# Patient Record
Sex: Male | Born: 1935 | Race: White | Hispanic: No | Marital: Married | Smoking: Former smoker
Health system: Southern US, Community
[De-identification: ages and names within clinical notes are randomized; demographics above are authoritative.]

## PROBLEM LIST (undated history)

## (undated) DIAGNOSIS — I10 Essential (primary) hypertension: Secondary | ICD-10-CM

## (undated) DIAGNOSIS — M1990 Unspecified osteoarthritis, unspecified site: Secondary | ICD-10-CM

## (undated) DIAGNOSIS — D649 Anemia, unspecified: Secondary | ICD-10-CM

## (undated) DIAGNOSIS — R0600 Dyspnea, unspecified: Secondary | ICD-10-CM

## (undated) DIAGNOSIS — R059 Cough, unspecified: Secondary | ICD-10-CM

## (undated) DIAGNOSIS — J449 Chronic obstructive pulmonary disease, unspecified: Secondary | ICD-10-CM

## (undated) DIAGNOSIS — R911 Solitary pulmonary nodule: Secondary | ICD-10-CM

## (undated) DIAGNOSIS — E785 Hyperlipidemia, unspecified: Secondary | ICD-10-CM

## (undated) DIAGNOSIS — R05 Cough: Secondary | ICD-10-CM

## (undated) HISTORY — DX: Dyspnea, unspecified: R06.00

## (undated) HISTORY — DX: Hyperlipidemia, unspecified: E78.5

## (undated) HISTORY — DX: Cough, unspecified: R05.9

## (undated) HISTORY — DX: Anemia, unspecified: D64.9

## (undated) HISTORY — PX: ROTATOR CUFF REPAIR: SHX139

## (undated) HISTORY — DX: Unspecified osteoarthritis, unspecified site: M19.90

## (undated) HISTORY — DX: Cough: R05

## (undated) HISTORY — DX: Chronic obstructive pulmonary disease, unspecified: J44.9

## (undated) HISTORY — PX: APPENDECTOMY: SHX54

## (undated) HISTORY — DX: Essential (primary) hypertension: I10

## (undated) HISTORY — DX: Solitary pulmonary nodule: R91.1

## (undated) HISTORY — PX: REPLACEMENT TOTAL KNEE BILATERAL: SUR1225

---

## 1994-09-30 HISTORY — PX: OTHER SURGICAL HISTORY: SHX169

## 2003-09-02 ENCOUNTER — Other Ambulatory Visit: Payer: Self-pay

## 2008-09-20 ENCOUNTER — Ambulatory Visit: Payer: Self-pay | Admitting: Internal Medicine

## 2008-09-20 ENCOUNTER — Encounter: Payer: Self-pay | Admitting: Pulmonary Disease

## 2008-10-04 ENCOUNTER — Ambulatory Visit: Payer: Self-pay | Admitting: Internal Medicine

## 2008-10-04 ENCOUNTER — Encounter: Payer: Self-pay | Admitting: Pulmonary Disease

## 2008-10-18 ENCOUNTER — Ambulatory Visit: Payer: Self-pay

## 2008-10-18 ENCOUNTER — Encounter: Payer: Self-pay | Admitting: Internal Medicine

## 2008-10-26 ENCOUNTER — Ambulatory Visit: Payer: Self-pay | Admitting: Internal Medicine

## 2008-11-02 DIAGNOSIS — J4489 Other specified chronic obstructive pulmonary disease: Secondary | ICD-10-CM | POA: Insufficient documentation

## 2008-11-02 DIAGNOSIS — E785 Hyperlipidemia, unspecified: Secondary | ICD-10-CM

## 2008-11-02 DIAGNOSIS — I10 Essential (primary) hypertension: Secondary | ICD-10-CM | POA: Insufficient documentation

## 2008-11-02 DIAGNOSIS — J449 Chronic obstructive pulmonary disease, unspecified: Secondary | ICD-10-CM

## 2008-11-03 ENCOUNTER — Ambulatory Visit: Payer: Self-pay | Admitting: Pulmonary Disease

## 2008-11-03 ENCOUNTER — Ambulatory Visit: Payer: Self-pay | Admitting: Internal Medicine

## 2008-11-03 DIAGNOSIS — R05 Cough: Secondary | ICD-10-CM

## 2008-11-03 DIAGNOSIS — M1990 Unspecified osteoarthritis, unspecified site: Secondary | ICD-10-CM | POA: Insufficient documentation

## 2008-11-03 DIAGNOSIS — J984 Other disorders of lung: Secondary | ICD-10-CM | POA: Insufficient documentation

## 2008-11-28 ENCOUNTER — Telehealth (INDEPENDENT_AMBULATORY_CARE_PROVIDER_SITE_OTHER): Payer: Self-pay | Admitting: *Deleted

## 2008-11-29 ENCOUNTER — Ambulatory Visit: Payer: Self-pay | Admitting: Internal Medicine

## 2008-11-29 DIAGNOSIS — D649 Anemia, unspecified: Secondary | ICD-10-CM | POA: Insufficient documentation

## 2008-11-29 DIAGNOSIS — R0609 Other forms of dyspnea: Secondary | ICD-10-CM | POA: Insufficient documentation

## 2008-11-29 DIAGNOSIS — R0989 Other specified symptoms and signs involving the circulatory and respiratory systems: Secondary | ICD-10-CM

## 2008-11-29 LAB — CONVERTED CEMR LAB
BUN: 19 mg/dL (ref 6–23)
Basophils Relative: 1.3 % (ref 0.0–3.0)
Calcium: 9 mg/dL (ref 8.4–10.5)
Chloride: 108 meq/L (ref 96–112)
Creatinine, Ser: 1.4 mg/dL (ref 0.4–1.5)
Eosinophils Absolute: 0.5 10*3/uL (ref 0.0–0.7)
Eosinophils Relative: 6.3 % — ABNORMAL HIGH (ref 0.0–5.0)
GFR calc Af Amer: 64 mL/min
GFR calc non Af Amer: 53 mL/min
HCT: 33 % — ABNORMAL LOW (ref 39.0–52.0)
Hemoglobin: 10.6 g/dL — ABNORMAL LOW (ref 13.0–17.0)
MCV: 75.1 fL — ABNORMAL LOW (ref 78.0–100.0)
Monocytes Absolute: 0.6 10*3/uL (ref 0.1–1.0)
Neutro Abs: 5.3 10*3/uL (ref 1.4–7.7)
RBC: 4.39 M/uL (ref 4.22–5.81)
WBC: 8.4 10*3/uL (ref 4.5–10.5)

## 2008-12-15 ENCOUNTER — Ambulatory Visit: Payer: Self-pay | Admitting: Pulmonary Disease

## 2008-12-16 ENCOUNTER — Telehealth (INDEPENDENT_AMBULATORY_CARE_PROVIDER_SITE_OTHER): Payer: Self-pay | Admitting: *Deleted

## 2008-12-21 ENCOUNTER — Telehealth: Payer: Self-pay | Admitting: Pulmonary Disease

## 2009-01-17 ENCOUNTER — Ambulatory Visit: Payer: Self-pay | Admitting: Pulmonary Disease

## 2009-02-06 ENCOUNTER — Ambulatory Visit (HOSPITAL_COMMUNITY): Admission: RE | Admit: 2009-02-06 | Discharge: 2009-02-06 | Payer: Self-pay | Admitting: Pulmonary Disease

## 2009-02-06 IMAGING — CT CT CHEST W/O CM
2 of 4 series · 15 of 36 positions shown, 18 images · non-contrast
Comparison: [DATE]

CLINICAL DATA: Left lower lobe nodule

CT CHEST WITHOUT CONTRAST
TECHNIQUE: Multidetector CT imaging of the chest was performed
following the standard protocol without IV contrast.

[Series 2: chest w/o st · axial · non-contrast · 0.83mm/px · z∈[-233,+7]mm · 12 of 58 slices shown, 15 images]
[im 5/58  mediastinal]
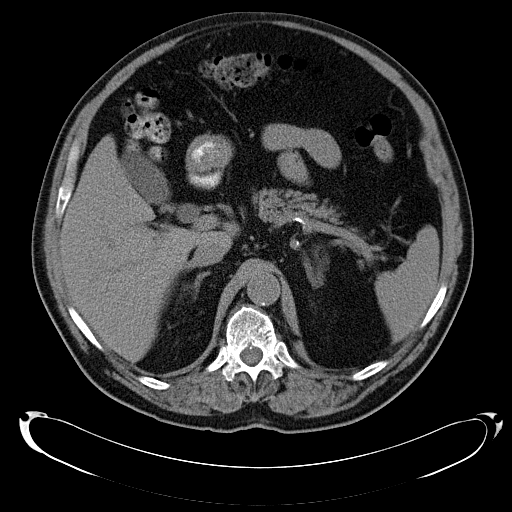
[im 5/58  lung]
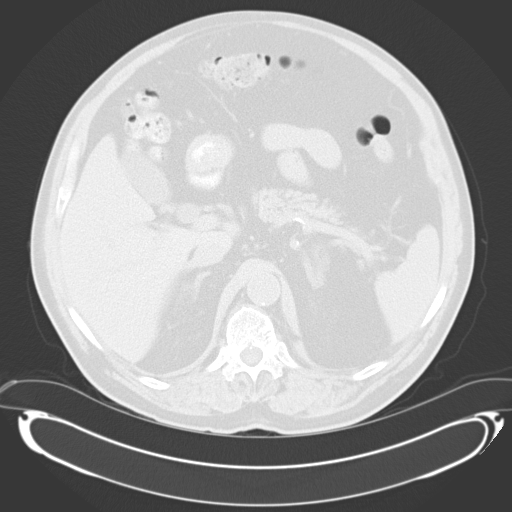
[im 9/58  lung]
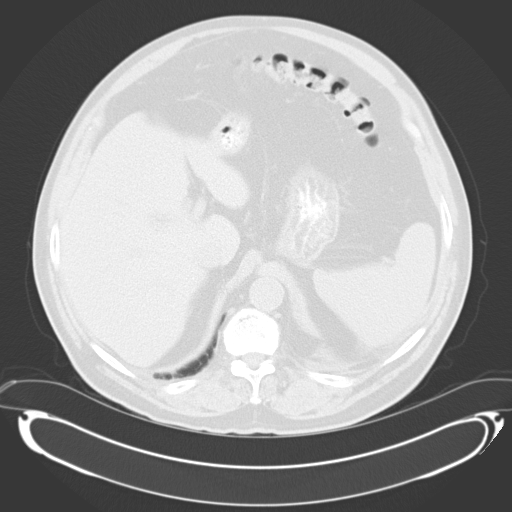
[im 13/58  lung]
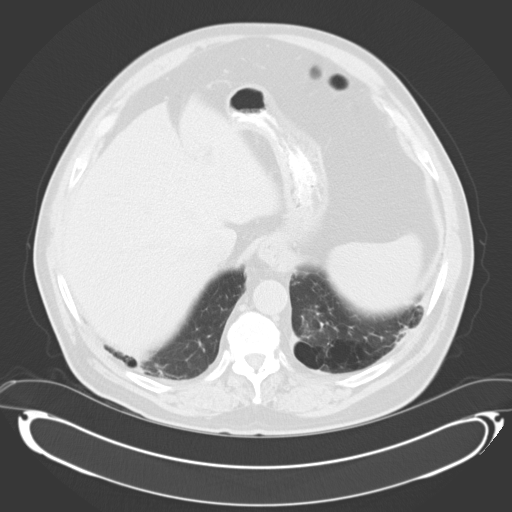
[im 17/58  lung]
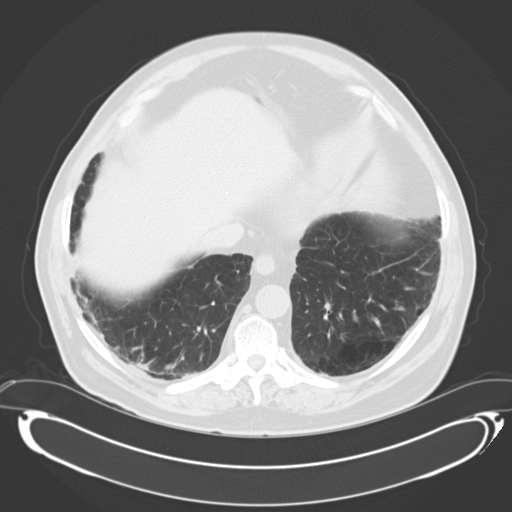
[im 21/58  mediastinal]
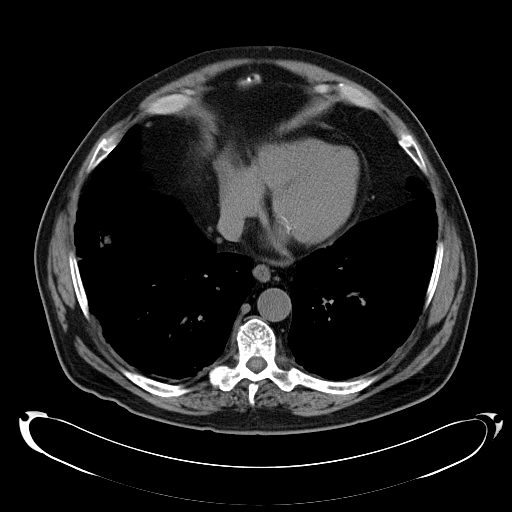
[im 21/58  lung]
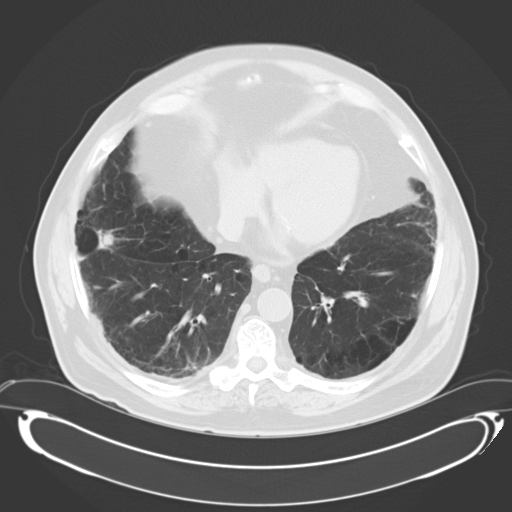
[im 25/58  lung]
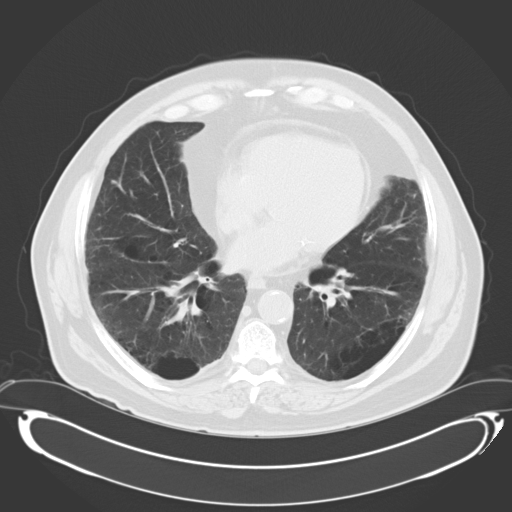
[im 33/58  lung]
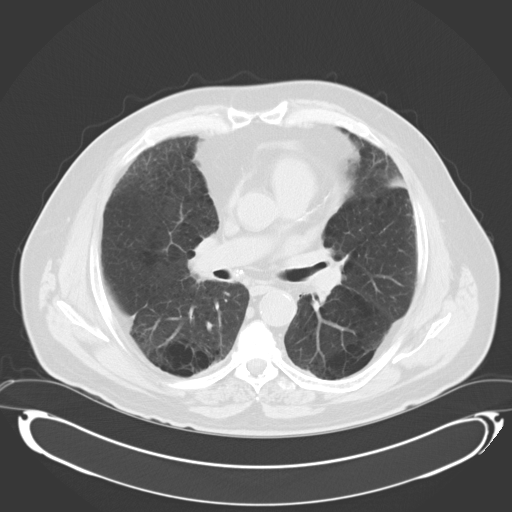
[im 37/58  lung]
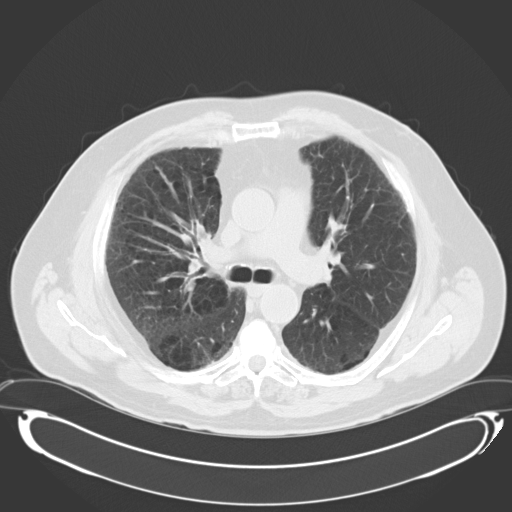
[im 41/58  mediastinal]
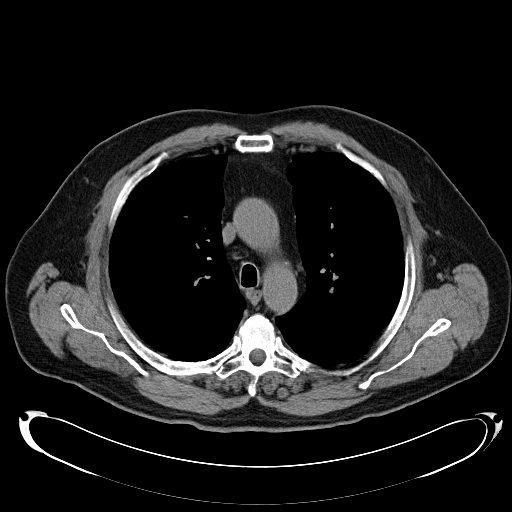
[im 41/58  lung]
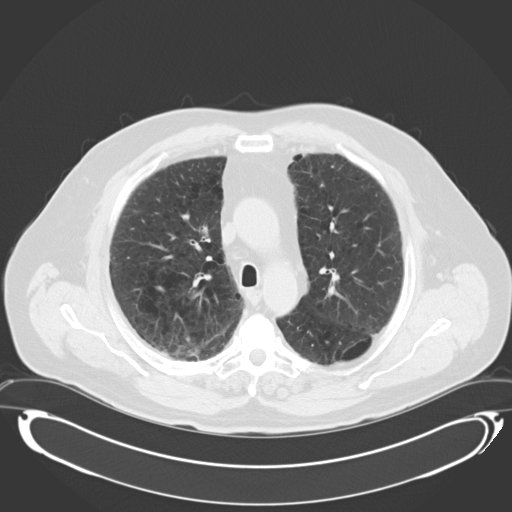
[im 45/58  lung]
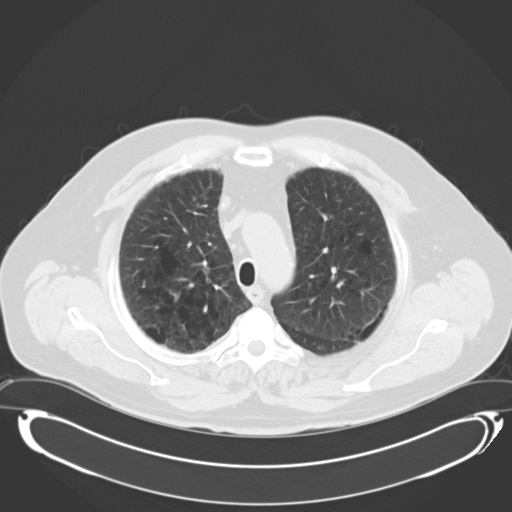
[im 49/58  lung]
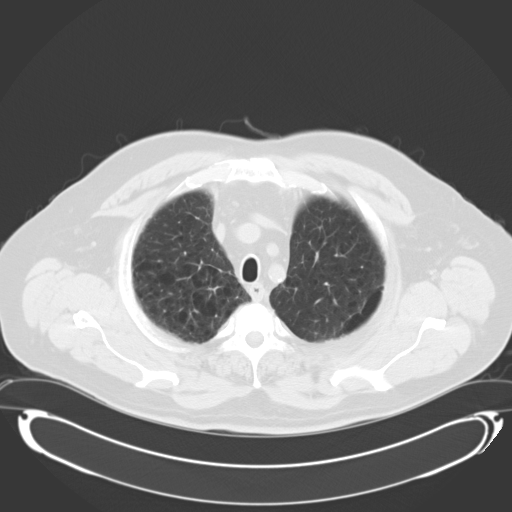
[im 53/58  lung]
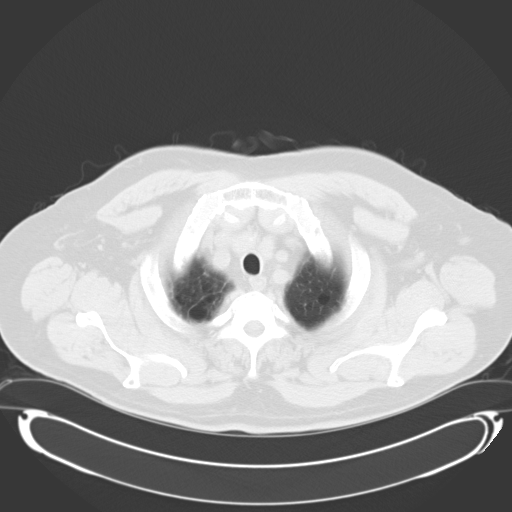

[Series 602: <mpr thick range> · coronal · 0.83mm/px · 3 of 104 slices shown]
[im 21/104  lung]
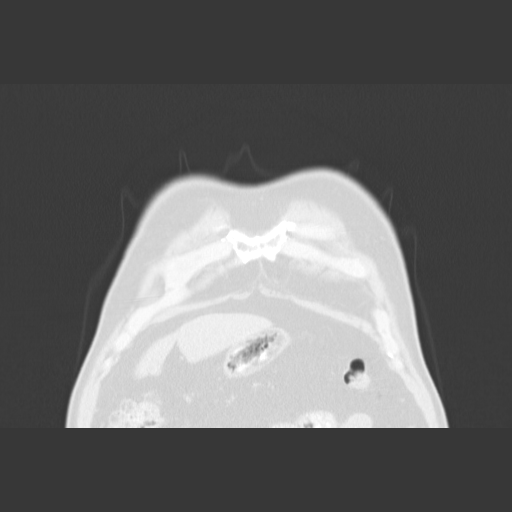
[im 42/104  lung]
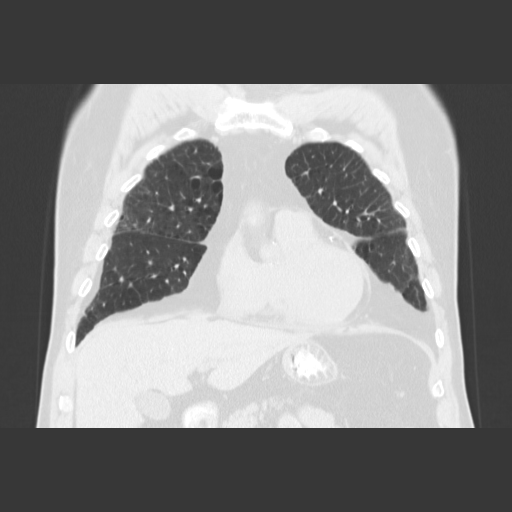
[im 62/104  lung]
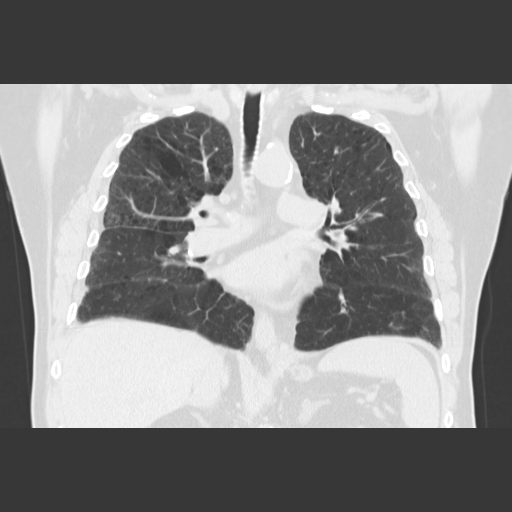

[15 of 36 positions shown; findings below may reference images not displayed]

FINDINGS: There is no enlarged axillary or supraclavicular lymph
nodes.

No enlarged mediastinal or hilar lymph nodes identified.

Calcified subcarinal and right hilar lymph node noted.

There is no pericardial effusion.

No pleural effusion.

Advanced interstitial lung disease is noted consistent with
pulmonary interstitial emphysema.

The subpleural density within the left lower lobe is again noted
measuring 0.6 x 1.4 cm, image 45. This is compared with 0.8 x
cm previously.

There is a subpleural density within the right middle lobe which
measures 0.9 cm, image 38.  On the previous exam this nodule
measured 0.3 cm.

Calcified granulomas identified within the perihilar right upper
lobe.

Review of the visualized osseous structures shows no suspicious
bone lesions.

Limited imaging through the upper abdomen shows a low density
within the left adrenal gland measuring 1.4 cm.

There are multiple calcified granulomas within the splenic
parenchyma.
IMPRESSION: 1.  Stable subpleural density within the left lower lobe.  There is
no malignant range FDG uptake associated this nodule on the PET CT
performed today.  The short axis diameter of this density lowers
the negative predicted value of PET CT.  For this reason, I would
advise continued interval follow-up overall parenchymal densities.
2.  Increase in size of density within the right middle lobe.  The
small size of this nodule also lowers the negative predicted value
of PET CT.  Given the patient's smoking history I would recommend a
follow-up examination and 3 months.  At this time of noncontrast CT
of the chest would be sufficient.

## 2009-02-21 ENCOUNTER — Ambulatory Visit: Payer: Self-pay | Admitting: Pulmonary Disease

## 2010-10-22 ENCOUNTER — Encounter: Payer: Self-pay | Admitting: Pulmonary Disease

## 2011-01-08 LAB — GLUCOSE, CAPILLARY: Glucose-Capillary: 105 mg/dL — ABNORMAL HIGH (ref 70–99)

## 2011-02-12 NOTE — Assessment & Plan Note (Signed)
[facility] OFFICE NOTE   [name], [name]                       MRN:          [id number]  DATE:09/20/2008                            DOB:          [birth year]    PRIMARY CARE PHYSICIAN:  Dr. Andrey Campanile at the [place], [place].   REFERRING PHYSICIAN:  Dr. Karleen Hampshire at [facility].   REASON FOR EVALUATION:  Dyspnea on exertion and fatigue.   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  He denies any history of known heart  disease.  He did have a stress test at the [place] several years ago, which  he said was negative.   He tells me that about 2-1/2 months ago, he underwent arthroscopic  surgery to remove some bone spurs in his back.  Since this surgery, he  has had a total lack of energy.  Prior to this, he was playing golf and  able to go to the driving range and do some potting without any  problems.  However, now he finds it difficult to walk more than 100  yards at a time.  He has decreased exercise tolerance and shortness of  breath.  He has not had any chest pain or palpitations.  He denies any  orthopnea, no lower extremity edema, and no PND.  He does have a  nonproductive cough and is wondering if he may have walking pneumonia.  He states he has gained about 15 pounds as well.  He had his thyroid  levels checked at the [place], [place].  He was told these were a little high,  but nothing to worry about.   REVIEW OF SYSTEMS:  He also is noted to have snoring, but his wife  states that he does not stop breathing.  He also has arthritis pain and  some reflux as well as prostate symptoms.  The remainder review of  systems is negative except for HPI and problem list.   PROBLEM LIST:  1. Hypertension.  2. Hyperlipidemia.  3. Probable chronic obstructive pulmonary disease.  4. Obesity.  5. Osteoarthritis.   PAST SURGICAL HISTORY:  1. Status post appendix  2. Status post back fusion 17 or 15 years ago with a recent      arthroscopic surgery to remove bone spurs.  3. Bilateral knee replacement.  4. Rotator cuff surgery.   CURRENT MEDICATIONS:  1. Albuterol inhalers.  2. Norvasc 5 a day.  3. HCTZ 25 a day.  4. Lisinopril 40 a day.  5. Terazosin 10 a day.  6. Colace 200 b.i.d.  7. Flunisolide nasal spray.  8. Loratadine 10 a day.  9. Simvastatin 40 a day.  10.Omeprazole 40 a day.  11.Celebrex 100 b.i.d.  12.Vitamins.  13.Motrin.   ALLERGIES:  No known drug allergies.   SOCIAL HISTORY:  He is married.  He has 3 children.  He is on  disability.  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  He  smoked, but quit 15 years ago.  Does not drink alcohol.   FAMILY HISTORY:  Father died of rheumatic heart disease.  Mother died  from old age.  He had 2 brothers, one died of alcoholism, the other died  from rheumatic heart disease.   PHYSICAL EXAMINATION:  GENERAL:  He is an elderly male, in no acute  distress, ambulates around the clinic without any respiratory  difficulty.  VITAL SIGNS:  Initial saturations were 98% on room air, on ambulating he  got down to 93.  Blood pressure is 142/68, heart rate is 92, and weight  is 218.  HEENT:  Normal.  NECK:  Thick and supple.  There is JVD.  Carotids are 2+ bilaterally  with no obvious bruits.  There is no lymphadenopathy or thyromegaly.  CARDIAC:  PMI is nondisplaced.  He is regular.  There is an S4 soft  systolic ejection murmur at the left sternal border.  LUNGS:  Clear with mildly decreased breath sounds.  No wheezing.  No  rales.  ABDOMEN:  Markedly obese, no obvious hepatosplenomegaly.  No bruits.  No  masses.  Good bowel sounds.  EXTREMITIES:  Warm with no cyanosis, clubbing, or edema.  No rash.  NEUROLOGIC:  Alert and oriented x3.  Cranial nerves II through XII are  intact.  Moves all 4 without  difficulty.  Affect is pleasant.   EKG shows sinus rhythm at a rate of 92 with no ST-T wave abnormalities.   ASSESSMENT AND PLAN:  1. Exertional dyspnea and fatigue.  This is a rather new onset.  I      suspect that this is multifactorial and likely a combination of      deconditioning, obesity, diastolic dysfunction, and chronic      obstructive pulmonary disease.  However, given the fairly rapid      time course, I think we also need to rule out underlying coronary      artery disease.  We discussed the possibilities of stress testing      versus cath.  We will proceed with a Myoview scan and an      echocardiogram.  We will also get PFTs and a chest x-ray as a      starting point.  We will get the results of these tests and I will      see him back in several weeks to discuss.  2. Chronic hypertension.  This is elevated.  This is followed by his      primary care physician.  Consider increasing his amlodipine.   DISPOSITION:  Pending the results of his studies.  At some point, he may  also benefit from a cardiopulmonary exercise test if needed.     Bevelyn Buckles. Bensimhon, MD  Electronically Signed    DRB/MedQ  DD: 09/20/2008  DT: 09/21/2008  Job #: [id number]   cc:   Dr. Karleen Hampshire

## 2011-02-12 NOTE — Assessment & Plan Note (Signed)
[facility] OFFICE NOTE   NAME:[name], [name] DINI                       MRN:          [id number]  DATE:10/26/2008                            DOB:          Mar 24, 1931    PRIMARY CARE PHYSICIAN:  Dr. Andrey Campanile at the [facility] in [place].   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  I first saw him back in December for  further evaluation of progressive exertional dyspnea.   Since that time, he has undergone a significant amount of testing.  He  has had an echocardiogram, which showed a normal ejection fraction and  mild aortic stenosis with a mean valve gradient of 15.8.  There is also  mild mitral regurgitation.  He also underwent a dobutamine Myoview which  showed an EF of 73%, with no ischemia or scar.  PFTs were done, which  showed moderate obstructive lung disease with moderate-to-severely  decreased DLCO, which corrected significantly for volume but still was  not normal.  His FEV-1 to FVC ratio was 64%.  Chest x-ray was consistent  with COPD.   He returns today for a routine followup.  He continues to have  significant dyspnea on just mild exertion.  He has not had any chest  pain or pressure.  No orthopnea, no PND, no lower extremity edema.   CURRENT MEDICATIONS:  1. Albuterol inhaler.  2. Amlodipine 5 a day.  3. HCTZ 25 a day.  4. Lisinopril 40 a day.  5. Terazosin 10 a day.  6. Colace 100 b.i.d.  7. Flunisolide nasal spray b.i.d.  8. Loratadine.  9. Simvastatin 40 a day.  10.Omeprazole 40 a day.  11.Celebrex 100 a day.  12.Aleve as needed.   PHYSICAL EXAMINATION:  GENERAL:  He is in no acute distress.  He  ambulates around the clinic without any respiratory difficulty.  On  ambulation, his saturations stay 94% or higher.  VITALS SIGNS:  Blood pressure 142/64, heart rate 80,  weight 214.  HEENT:  Normal.  NECK:  Supple.  No obvious JVD.  Carotids are 2+ bilaterally without any  bruits.  There is no lymphadenopathy or thyromegaly.  CARDIAC:  He has  distant heart sounds.  PMI is nondisplaced.  Regular rate and rhythm  with an S4 and a soft systolic ejection murmur over the right sternal  border.  S2 is preserved.  LUNGS:  Diminished breath sounds throughout with no wheezing, no rales.  There is a prolonged expiratory phase.  ABDOMEN:  Obese, nontender, nondistended.  No obvious  hepatosplenomegaly.  No bruits.  No masses.  Good bowel sounds.  EXTREMITIES:  Warm with no cyanosis, clubbing, or edema.  No rash.  NEUROLOGIC:  Alert and oriented x3.  Cranial nerves II through XII are  intact.  Moves all 4 without difficulty.  Affect is pleasant.   ASSESSMENT/PLAN:  1. Exertional dyspnea.  I suspect this is most likely related to      underlying lung disease.  His cardiac workup so far has been      negative except for mild aortic stenosis, which I doubt is a      significant factor here.  Given his decreased DLCO, we will proceed      with a high-resolution CT scan of the chest to rule out pulmonary      fibrosis, though I think this is less likely.  We have also      referred him to a pulmonologist for further evaluation.  We will      see him back in several months as needed.  Should his dyspnea not      be responding to pulmonary therapy, we could consider cardiac      catheterization in the future, but I do not see any indication for      it currently.  2. Hypertension.  This is followed by his primary care physician with      suggestion of increasing his amlodipine to 10 a day.  I would also      consider minimizing or stopping his Aleve and Celebrex, as these      are likely contributing to his hypertension and also can be      dangerous for his kidneys.     Bevelyn Buckles. Bensimhon, MD  Electronically Signed    DRB/MedQ  DD: 10/26/2008  DT: 10/27/2008   Job #: [id number]   cc:   Eliot Ford, M.D.  @ [facility] Dr. Andrey Campanile

## 2011-03-21 ENCOUNTER — Encounter: Payer: Self-pay | Admitting: Cardiovascular Disease

## 2012-07-29 ENCOUNTER — Ambulatory Visit: Payer: Self-pay | Admitting: Ophthalmology

## 2012-08-11 ENCOUNTER — Ambulatory Visit: Payer: Self-pay | Admitting: Ophthalmology

## 2012-09-15 ENCOUNTER — Ambulatory Visit: Payer: Self-pay | Admitting: Ophthalmology

## 2012-09-15 LAB — HEMOGLOBIN: HGB: 13.9 g/dL (ref 13.0–18.0)

## 2012-09-15 LAB — POTASSIUM: Potassium: 3.7 mmol/L (ref 3.5–5.1)

## 2012-09-29 ENCOUNTER — Ambulatory Visit: Payer: Self-pay | Admitting: Ophthalmology

## 2014-02-28 DEATH — deceased

## 2015-01-17 NOTE — Op Note (Signed)
PATIENT NAME:  [name] [name], [name] [name] MR#:  [id number] DATE OF BIRTH:  1931-02-26  DATE OF PROCEDURE:  08/11/2012  PREOPERATIVE DIAGNOSIS: Visually significant cataract of the left eye.   POSTOPERATIVE DIAGNOSIS: Visually significant cataract of the left eye.   OPERATIVE PROCEDURE: Cataract extraction by phacoemulsification with implant of intraocular lens to left eye.   SURGEON: Galen ManilaWilliam Alani Sabbagh, MD.   ANESTHESIA:  1. Managed anesthesia care.  2. Topical tetracaine drops followed by 2% Xylocaine jelly applied in the preoperative holding area.   COMPLICATIONS: None.   TECHNIQUE:  Stop and chop.   DESCRIPTION OF PROCEDURE: The patient was examined and consented in the preoperative holding area where the aforementioned topical anesthesia was applied to the left eye and then brought back to the Operating Room where the left eye was prepped and draped in the usual sterile ophthalmic fashion and a lid speculum was placed. A paracentesis was created with the side port blade and the anterior chamber was filled with viscoelastic. A near clear corneal incision was performed with the steel keratome. A continuous curvilinear capsulorrhexis was performed with a cystotome followed by the capsulorrhexis forceps. Hydrodissection and hydrodelineation were carried out with BSS on a blunt cannula. The lens was removed in a stop and chop technique and the remaining cortical material was removed with the irrigation-aspiration handpiece. The capsular bag was inflated with viscoelastic and the Tecnis ZCB00 22.0-diopter lens, serial number [id number] was placed in the capsular bag without complication. The remaining viscoelastic was removed from the eye with the irrigation-aspiration handpiece. The wounds were hydrated. The anterior chamber was flushed with Miostat and the eye was inflated to physiologic pressure. The wounds were found to be water tight. The eye was dressed with Vigamox. The patient was given protective glasses  to wear throughout the day and a shield with which to sleep tonight. The patient was also given drops with which to begin a drop regimen today and will follow-up with me in one day.  ____________________________ Jerilee FieldWilliam L. Terika Pillard, MD wlp:slb D: 08/11/2012 15:51:11 ET T: 08/11/2012 16:38:06 ET JOB#: [id number]  cc: Shahir Karen L. Aliz Meritt, MD, <Dictator> Jerilee FieldWILLIAM L Ashaun Gaughan MD ELECTRONICALLY SIGNED 08/12/2012 13:23

## 2015-01-20 NOTE — Op Note (Signed)
PATIENT NAME:  [name] [name], [name] [name] MR#:  [id number] DATE OF BIRTH:  [birth year]  DATE OF PROCEDURE:  09/29/2012  LOCATION:  [facility]  PREOPERATIVE DIAGNOSIS: Visually significant cataract of the right eye.   POSTOPERATIVE DIAGNOSIS: Visually significant cataract of the right eye.   OPERATIVE PROCEDURE: Cataract extraction by phacoemulsification with implant of intraocular lens to right eye.   SURGEON: Galen ManilaWilliam Ernesto Zukowski, MD.   ANESTHESIA:  1. Managed anesthesia care.  2. Topical tetracaine drops followed by 2% Xylocaine jelly applied in the preoperative holding area.   COMPLICATIONS: None.   TECHNIQUE:  Stop and chop.  DESCRIPTION OF PROCEDURE: The patient was examined and consented in the preoperative holding area where the aforementioned topical anesthesia was applied to the right eye and then brought back to the Operating Room where the right eye was prepped and draped in the usual sterile ophthalmic fashion and a lid speculum was placed. A paracentesis was created with the side port blade and the anterior chamber was filled with viscoelastic. A near clear corneal incision was performed with the steel keratome. A continuous curvilinear capsulorrhexis was performed with a cystotome followed by the capsulorrhexis forceps. Hydrodissection and hydrodelineation were carried out with BSS on a blunt cannula. The lens was removed in a stop and chop technique and the remaining cortical material was removed with the irrigation-aspiration handpiece. The capsular bag was inflated with viscoelastic and the Tecnis ZCB00, 22.0-diopter lens, serial number [id number], was placed in the capsular bag without complication. The remaining viscoelastic was removed from the eye with the irrigation-aspiration handpiece. The wounds were hydrated. The anterior chamber was flushed with Miostat and the eye was inflated to physiologic pressure. Next, 0.1 mL of cefuroxime concentration 10 mg/mL was placed in the  anterior chamber.  The wounds were found to be water tight. The eye was dressed with Vigamox and Combigan. The patient was given protective glasses to wear throughout the day and a shield with which to sleep tonight. The patient was also given drops with which to begin a drop regimen today and will follow-up with me in one day.    ____________________________ Jerilee FieldWilliam L. Breena Bevacqua, MD wlp:jm D: 09/29/2012 13:32:39 ET T: 09/29/2012 14:24:55 ET JOB#: [id number]  cc: Shylah Dossantos L. Yuriana Gaal, MD, <Dictator> Jerilee FieldWILLIAM L Claudia Alvizo MD ELECTRONICALLY SIGNED 09/29/2012 16:32
